# Patient Record
Sex: Female | Born: 2000 | Race: White | Hispanic: No | Marital: Single | Smoking: Never smoker
Health system: Southern US, Community
[De-identification: ages and names within clinical notes are randomized; demographics above are authoritative.]

## PROBLEM LIST (undated history)

## (undated) DIAGNOSIS — F988 Other specified behavioral and emotional disorders with onset usually occurring in childhood and adolescence: Secondary | ICD-10-CM

## (undated) DIAGNOSIS — L309 Dermatitis, unspecified: Secondary | ICD-10-CM

## (undated) DIAGNOSIS — E282 Polycystic ovarian syndrome: Secondary | ICD-10-CM

## (undated) DIAGNOSIS — J45909 Unspecified asthma, uncomplicated: Secondary | ICD-10-CM

## (undated) HISTORY — PX: LAPAROSCOPIC OVARIAN CYSTECTOMY: SHX6248

## (undated) HISTORY — PX: WISDOM TOOTH EXTRACTION: SHX21

## (undated) HISTORY — DX: Dermatitis, unspecified: L30.9

## (undated) HISTORY — PX: TONSILLECTOMY: SUR1361

## (undated) HISTORY — PX: ADENOIDECTOMY: SUR15

## (undated) HISTORY — DX: Unspecified asthma, uncomplicated: J45.909

---
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
## 2022-03-16 DIAGNOSIS — F4312 Post-traumatic stress disorder, chronic: Secondary | ICD-10-CM | POA: Insufficient documentation

## 2022-06-03 DIAGNOSIS — F319 Bipolar disorder, unspecified: Secondary | ICD-10-CM | POA: Insufficient documentation

## 2023-05-15 ENCOUNTER — Ambulatory Visit: Payer: Self-pay | Admitting: Allergy

## 2023-05-19 ENCOUNTER — Other Ambulatory Visit (HOSPITAL_COMMUNITY): Payer: BC Managed Care – PPO

## 2023-05-19 ENCOUNTER — Emergency Department (HOSPITAL_BASED_OUTPATIENT_CLINIC_OR_DEPARTMENT_OTHER)
Admission: EM | Admit: 2023-05-19 | Discharge: 2023-05-19 | Disposition: A | Discharge status: Home / Self Care | Payer: BC Managed Care – PPO | Attending: Emergency Medicine | Admitting: Emergency Medicine

## 2023-05-19 ENCOUNTER — Other Ambulatory Visit: Payer: Self-pay

## 2023-05-19 ENCOUNTER — Encounter (HOSPITAL_BASED_OUTPATIENT_CLINIC_OR_DEPARTMENT_OTHER): Payer: Self-pay

## 2023-05-19 ENCOUNTER — Emergency Department (HOSPITAL_COMMUNITY): Payer: BC Managed Care – PPO

## 2023-05-19 DIAGNOSIS — R102 Pelvic and perineal pain: Secondary | ICD-10-CM

## 2023-05-19 DIAGNOSIS — N83202 Unspecified ovarian cyst, left side: Secondary | ICD-10-CM | POA: Diagnosis not present

## 2023-05-19 HISTORY — DX: Polycystic ovarian syndrome: E28.2

## 2023-05-19 LAB — CBC WITH DIFFERENTIAL/PLATELET
Abs Immature Granulocytes: 0.01 10*3/uL (ref 0.00–0.07)
Basophils Absolute: 0.1 10*3/uL (ref 0.0–0.1)
Basophils Relative: 1 %
Eosinophils Absolute: 0.3 10*3/uL (ref 0.0–0.5)
Eosinophils Relative: 4 %
HCT: 38.2 % (ref 36.0–46.0)
Hemoglobin: 12.8 g/dL (ref 12.0–15.0)
Immature Granulocytes: 0 %
Lymphocytes Relative: 47 %
Lymphs Abs: 4.4 10*3/uL — ABNORMAL HIGH (ref 0.7–4.0)
MCH: 29.1 pg (ref 26.0–34.0)
MCHC: 33.5 g/dL (ref 30.0–36.0)
MCV: 86.8 fL (ref 80.0–100.0)
Monocytes Absolute: 0.4 10*3/uL (ref 0.1–1.0)
Monocytes Relative: 5 %
Neutro Abs: 3.9 10*3/uL (ref 1.7–7.7)
Neutrophils Relative %: 43 %
Platelets: 345 10*3/uL (ref 150–400)
RBC: 4.4 MIL/uL (ref 3.87–5.11)
RDW: 12.4 % (ref 11.5–15.5)
WBC: 9.1 10*3/uL (ref 4.0–10.5)
nRBC: 0 % (ref 0.0–0.2)

## 2023-05-19 LAB — BASIC METABOLIC PANEL
Anion gap: 7 (ref 5–15)
BUN: 13 mg/dL (ref 6–20)
CO2: 25 mmol/L (ref 22–32)
Calcium: 9.1 mg/dL (ref 8.9–10.3)
Chloride: 108 mmol/L (ref 98–111)
Creatinine, Ser: 0.89 mg/dL (ref 0.44–1.00)
GFR, Estimated: >60 mL/min (ref >60)
Glucose, Bld: 90 mg/dL (ref 70–99)
Potassium: 3.8 mmol/L (ref 3.5–5.1)
Sodium: 140 mmol/L (ref 135–145)

## 2023-05-19 LAB — HCG, SERUM, QUALITATIVE: Preg, Serum: NEGATIVE

## 2023-05-19 MED ORDER — ONDANSETRON HCL 4 MG/2ML IJ SOLN
INTRAMUSCULAR | Status: AC
  Filled 2023-05-19: qty 2

## 2023-05-19 MED ORDER — ONDANSETRON HCL 4 MG/2ML IJ SOLN
4.0000 mg | Freq: Once | INTRAMUSCULAR | Status: DC
  Filled 2023-05-19: qty 2

## 2023-05-19 MED ORDER — KETOROLAC TROMETHAMINE 30 MG/ML IJ SOLN
15.0000 mg | Freq: Once | INTRAMUSCULAR | Status: AC
  Administered 2023-05-19: 15 mg via INTRAVENOUS
  Filled 2023-05-19: qty 1

## 2023-05-19 MED ORDER — OXYCODONE-ACETAMINOPHEN 5-325 MG PO TABS: 1.0000 | ORAL_TABLET | Freq: Four times a day (QID) | ORAL | 0 refills | Status: DC | PRN

## 2023-05-19 MED ORDER — MORPHINE SULFATE (PF) 4 MG/ML IV SOLN
4.0000 mg | Freq: Once | INTRAVENOUS | Status: AC
  Administered 2023-05-19: 4 mg via INTRAVENOUS
  Filled 2023-05-19: qty 1

## 2023-05-19 MED ORDER — FENTANYL CITRATE PF 50 MCG/ML IJ SOSY
25.0000 ug | PREFILLED_SYRINGE | Freq: Once | INTRAMUSCULAR | Status: AC
  Administered 2023-05-19: 25 ug via INTRAVENOUS
  Filled 2023-05-19: qty 1

## 2023-05-19 MED ORDER — ONDANSETRON HCL 4 MG/2ML IJ SOLN
4.0000 mg | Freq: Once | INTRAMUSCULAR | Status: AC
  Administered 2023-05-19: 4 mg via INTRAVENOUS

## 2023-05-19 NOTE — ED Notes (Signed)
Pt OTF for ultrasound

## 2023-05-19 NOTE — ED Triage Notes (Signed)
 Pt woke up an hour ago with severe lower abdominal pain. Pt recently diagnosed with left ovarian cyst and is scheduled for surgery in a couple of weeks. This pain is consistent with cyst related pain. Denies vomiting and/or diarrhea, but does endorse nausea with the pain.

## 2023-05-19 NOTE — ED Provider Notes (Signed)
 Patient arriving from [facility] as a transfer to get a pelvic ultrasound to rule out torsion.  Patient with a known left ovarian cyst that she reports is going to have surgery in the next few weeks once it gets scheduled but woke up this morning with severe pain.  There is no ultrasound available at [facility] and OB/GYN recommended transfer for ultrasound.  Patient is still having significant pain in her left lower abdomen.  Blood work and urine pregnancy were negative.  Ultrasound was ordered.  9:46 AM Results of the ultrasound show:  1. No evidence of ovarian torsion. 2. Complex left ovarian cystic structure containing thick internal septations and an eccentric, irregular, mildly echogenic structure measuring 4.0 x 3.8 x 3.7 cm, which demonstrates internal vascularity on Doppler evaluation. While this structure may represent a hemorrhagic cyst/endometrioma, the apparent internal vascularity of the echogenic structure raises suspicion for epithelial ovarian neoplasm. Per clinical records, surgery is planned. 3. Small volume pelvic free fluid. Patient is planning on surgery.  At this time we will provide further medication that she can use for pain control. Findings discussed with the patient and her family member who is present at bedside.  At this time will give a prescription for pain medication to use as needed and she will call her OB/GYN in the morning.   Gwyneth Sprout, MD 05/19/23 [phone]

## 2023-05-19 NOTE — Discharge Instructions (Signed)
 There was no sign of torsion today of your ovary.  However you do have a large cyst there which is most likely what is causing your pain.  Use the pain medication as needed and follow-up with your OB/GYN tomorrow.

## 2023-05-19 NOTE — ED Provider Notes (Signed)
 [place] EMERGENCY DEPARTMENT AT [facility] Provider Note   CSN: [id number] Arrival date & time: 05/19/23  0534     History  Chief Complaint  Patient presents with   Abdominal Pain    Patty Mccarthy is a 23 y.o. female.  Patient presents to the emergency department for evaluation of severe left lower abdominal/pelvic pain that started an hour ago.  Patient reports that she has a cyst on her left ovary and is pending possible surgery for this.  She reports that she has been having some pain for 3 weeks but this morning the pain became severe quite suddenly, waking her from sleep.       Home Medications Prior to Admission medications   Medication Sig Start Date End Date Taking? Authorizing Provider  cetirizine (ZYRTEC) 10 MG tablet Take 10 mg by mouth daily.    [provider]  lisdexamfetamine (VYVANSE) 40 MG capsule Take 40 mg by mouth every morning.    [provider]      Allergies    Doxycycline hyclate and Prednisone    Review of Systems   Review of Systems  Physical Exam Updated Vital Signs BP 122/80   Pulse 74   Temp 98.6 F (37 C) (Oral)   Resp 15   SpO2 100%  Physical Exam Vitals and nursing note reviewed.  Constitutional:      General: She is in acute distress.     Appearance: She is well-developed.  HENT:     Head: Normocephalic and atraumatic.     Mouth/Throat:     Mouth: Mucous membranes are moist.  Eyes:     General: Vision grossly intact. Gaze aligned appropriately.     Extraocular Movements: Extraocular movements intact.     Conjunctiva/sclera: Conjunctivae normal.  Cardiovascular:     Rate and Rhythm: Normal rate and regular rhythm.     Pulses: Normal pulses.     Heart sounds: Normal heart sounds, S1 normal and S2 normal. No murmur heard.    No friction rub. No gallop.  Pulmonary:     Effort: Pulmonary effort is normal. No respiratory distress.     Breath sounds: Normal breath sounds.  Abdominal:      General: Bowel sounds are normal.     Palpations: Abdomen is soft.     Tenderness: There is abdominal tenderness in the left lower quadrant. There is no guarding or rebound.     Hernia: No hernia is present.  Musculoskeletal:        General: No swelling.     Cervical back: Full passive range of motion without pain, normal range of motion and neck supple. No spinous process tenderness or muscular tenderness. Normal range of motion.     Right lower leg: No edema.     Left lower leg: No edema.  Skin:    General: Skin is warm and dry.     Capillary Refill: Capillary refill takes less than 2 seconds.     Findings: No ecchymosis, erythema, rash or wound.  Neurological:     General: No focal deficit present.     Mental Status: She is alert and oriented to person, place, and time.     GCS: GCS eye subscore is 4. GCS verbal subscore is 5. GCS motor subscore is 6.     Cranial Nerves: Cranial nerves 2-12 are intact.     Sensory: Sensation is intact.     Motor: Motor function is intact.     Coordination: Coordination  is intact.  Psychiatric:        Attention and Perception: Attention normal.        Mood and Affect: Mood normal.        Speech: Speech normal.        Behavior: Behavior normal.     ED Results / Procedures / Treatments   Labs (all labs ordered are listed, but only abnormal results are displayed) Labs Reviewed  CBC WITH DIFFERENTIAL/PLATELET - Abnormal; Notable for the following components:      Result Value   Lymphs Abs 4.4 (*)    All other components within normal limits  BASIC METABOLIC PANEL  HCG, SERUM, QUALITATIVE    EKG None  Radiology No results found.  Procedures Procedures    Medications Ordered in ED Medications  fentaNYL (SUBLIMAZE) injection 25 mcg (25 mcg Intravenous Given 05/19/23 0615)  ondansetron (ZOFRAN) 4 MG/2ML injection (4 mg Intravenous Given 05/19/23 1610)    ED Course/ Medical Decision Making/ A&P                                  Medical Decision Making Amount and/or Complexity of Data Reviewed Labs: ordered.  Risk Prescription drug management.   Patient with sudden onset severe left lower quadrant pain.  Patient with history of PCOS.  Outside ultrasound was reviewed.  Patient does have a large cystic lesion on the left ovary, felt to be hemorrhagic cyst.  Differential diagnosis would be painful ovarian cyst, ruptured hemorrhagic cyst, ovarian torsion.  Pregnancy negative, so ectopic pregnancy ruled out.  Discussed with Dr. Vergie Living, on-call for OB/GYN at [facility].    A quick FAST exam does not show any fluid in the abdomen or pelvis.  Hemoglobin is normal.  Patient accepted for transfer for pelvic ultrasound to rule out torsion.  FAST BEDSIDE US Indication: pelvic pain, rule out ruptured hemorrhagic cyst  3 Views obtained: Splenorenal, Morrison's Pouch, Retrovesical No free fluid in abdomen  Archived electronically I personally performed and interrepreted the images         Final Clinical Impression(s) / ED Diagnoses Final diagnoses:  Pelvic pain  Cyst of left ovary    Rx / DC Orders ED Discharge Orders     None         Gilda Crease, MD 05/19/23 [phone]

## 2023-05-29 ENCOUNTER — Ambulatory Visit: Payer: Self-pay | Admitting: Allergy

## 2023-06-25 NOTE — Progress Notes (Unsigned)
 New Patient Note  RE: Patty Mccarthy MRN: [id number] DOB: Aug 12, 2000 Date of Office Visit: 06/26/2023  Consult requested by: Barbarann Ehlers Primary care provider: System, Provider Not In  Chief Complaint: No chief complaint on file.  History of Present Illness: I had the pleasure of seeing Patty Mccarthy for initial evaluation at the [facility] on 06/25/2023. She is a 23 y.o. female, who is referred here by System, Provider Not In for the evaluation of NSAID allergy.  Discussed the use of AI scribe software for clinical note transcription with the patient, who gave verbal consent to proceed.  History of Present Illness             Drug reaction: Reaction to *** which occurred *** ago.  Patient was being treated for ***. Symptoms started after taking ***. Symptoms include: *** Symptoms persisted for ***.  Treatment included: ***. Mucosal involvement: {Blank single:19197::"yes ***","no"}.  Denies any *** fevers, chills, changes in medications, foods, personal care products or recent infections. she has tried the following therapies: *** with *** benefit. Systemic steroids ***.  Previous work up includes: ***. Previous history of rash/hives: ***. Previous history of drug reactions: ***.  Assessment and Plan: [name] is a 23 y.o. female with: ***  Assessment and Plan               No follow-ups on file.  No orders of the defined types were placed in this encounter.  Lab Orders  No laboratory test(s) ordered today    Other allergy screening: Asthma: {Blank single:19197::"yes","no"} Rhino conjunctivitis: {Blank single:19197::"yes","no"} Food allergy: {Blank single:19197::"yes","no"} Medication allergy: {Blank single:19197::"yes","no"} Hymenoptera allergy: {Blank single:19197::"yes","no"} Urticaria: {Blank single:19197::"yes","no"} Eczema:{Blank single:19197::"yes","no"} History of recurrent infections suggestive of  immunodeficency: {Blank single:19197::"yes","no"}  Diagnostics: Spirometry:  Tracings reviewed. Her effort: {Blank single:19197::"Good reproducible efforts.","It was hard to get consistent efforts and there is a question as to whether this reflects a maximal maneuver.","Poor effort, data can not be interpreted."} FVC: ***L FEV1: ***L, ***% predicted FEV1/FVC ratio: ***% Interpretation: {Blank single:19197::"Spirometry consistent with mild obstructive disease","Spirometry consistent with moderate obstructive disease","Spirometry consistent with severe obstructive disease","Spirometry consistent with possible restrictive disease","Spirometry consistent with mixed obstructive and restrictive disease","Spirometry uninterpretable due to technique","Spirometry consistent with normal pattern","No overt abnormalities noted given today's efforts"}.  Please see scanned spirometry results for details.  Skin Testing: {Blank single:19197::"Select foods","Environmental allergy panel","Environmental allergy panel and select foods","Food allergy panel","None","Deferred due to recent antihistamines use"}. *** Results discussed with patient/family.   Past Medical History: There are no active problems to display for this patient.  Past Medical History:  Diagnosis Date  . ADD (attention deficit disorder)   . PCOS (polycystic ovarian syndrome)    Past Surgical History: Past Surgical History:  Procedure Laterality Date  . TONSILLECTOMY     Medication List:  Current Outpatient Medications  Medication Sig Dispense Refill  . cetirizine (ZYRTEC) 10 MG tablet Take 10 mg by mouth daily.    Marland Kitchen lisdexamfetamine (VYVANSE) 40 MG capsule Take 40 mg by mouth every morning.    Marland Kitchen oxyCODONE-acetaminophen (PERCOCET/ROXICET) 5-325 MG tablet Take 1 tablet by mouth every 6 (six) hours as needed for severe pain (pain score 7-10). 15 tablet 0   No current facility-administered medications for this visit.    Allergies: Allergies  Allergen Reactions  . Doxycycline Hyclate Nausea And Vomiting  . Prednisone Hives   Social History: Social History   Socioeconomic History  . Marital status: Single    Spouse name: Not on  file  . Number of children: Not on file  . Years of education: Not on file  . Highest education level: Not on file  Occupational History  . Not on file  Tobacco Use  . Smoking status: Passive Smoke Exposure - Never Smoker  . Smokeless tobacco: Not on file  Substance and Sexual Activity  . Alcohol use: Not on file  . Drug use: Not on file  . Sexual activity: Not on file  Other Topics Concern  . Not on file  Social History Narrative  . Not on file   Social Drivers of Health   Financial Resource Strain: Patient Declined (06/21/2023)   Received from [facility]   Overall Financial Resource Strain (CARDIA)   . Difficulty of Paying Living Expenses: Patient declined  Recent Concern: Financial Resource Strain - High Risk (03/23/2023)   Received from [facility]   Overall Financial Resource Strain (CARDIA)   . Difficulty of Paying Living Expenses: Hard  Food Insecurity: Patient Declined (06/21/2023)   Received from [facility]   Hunger Vital Sign   . Worried About Programme researcher, broadcasting/film/video in the Last Year: Patient declined   . Ran Out of Food in the Last Year: Patient declined  Recent Concern: Food Insecurity - Food Insecurity Present (03/23/2023)   Received from [facility]   Hunger Vital Sign   . Worried About Programme researcher, broadcasting/film/video in the Last Year: Sometimes true   . Ran Out of Food in the Last Year: Sometimes true  Transportation Needs: Patient Declined (06/21/2023)   Received from [facility] - Transportation   . Lack of Transportation (Medical): Patient declined   . Lack of Transportation (Non-Medical): Patient declined  Physical Activity: Unknown (06/21/2023)   Received from [facility]   Exercise Vital Sign   . Days of Exercise per Week:  Patient declined   . Minutes of Exercise per Session: 20 min  Recent Concern: Physical Activity - Insufficiently Active (03/23/2023)   Received from [facility]   Exercise Vital Sign   . Days of Exercise per Week: 3 days   . Minutes of Exercise per Session: 20 min  Stress: Patient Declined (06/21/2023)   Received from [facility] - Occupational Stress Questionnaire   . Feeling of Stress : Patient declined  Social Connections: Patient Declined (06/21/2023)   Received from [facility]   Social Network   . How would you rate your social network (family, work, friends)?: Patient declined  Recent Concern: Social Connections - Somewhat Isolated (03/23/2023)   Received from [facility]   Social Network   . How would you rate your social network (family, work, friends)?: Restricted participation with some degree of social isolation   Lives in a ***. Smoking: *** Occupation: ***  Environmental HistorySurveyor, minerals in the house: Copywriter, advertising in the family room: {Blank single:19197::"yes","no"} Carpet in the bedroom: {Blank single:19197::"yes","no"} Heating: {Blank single:19197::"electric","gas","heat pump"} Cooling: {Blank single:19197::"central","window","heat pump"} Pet: {Blank single:19197::"yes ***","no"}  Family History: No family history on file. Problem                               Relation Asthma                                   *** Eczema                                ***  Food allergy                          *** Allergic rhino conjunctivitis     ***  Review of Systems  Constitutional:  Negative for appetite change, chills, fever and unexpected weight change.  HENT:  Negative for congestion and rhinorrhea.   Eyes:  Negative for itching.  Respiratory:  Negative for cough, chest tightness, shortness of breath and wheezing.   Cardiovascular:  Negative for chest pain.  Gastrointestinal:   Negative for abdominal pain.  Genitourinary:  Negative for difficulty urinating.  Skin:  Negative for rash.  Neurological:  Negative for headaches.   Objective: There were no vitals taken for this visit. There is no height or weight on file to calculate BMI. Physical Exam Vitals and nursing note reviewed.  Constitutional:      Appearance: Normal appearance. She is well-developed.  HENT:     Head: Normocephalic and atraumatic.     Right Ear: Tympanic membrane and external ear normal.     Left Ear: Tympanic membrane and external ear normal.     Nose: Nose normal.     Mouth/Throat:     Mouth: Mucous membranes are moist.     Pharynx: Oropharynx is clear.  Eyes:     Conjunctiva/sclera: Conjunctivae normal.  Cardiovascular:     Rate and Rhythm: Normal rate and regular rhythm.     Heart sounds: Normal heart sounds. No murmur heard.    No friction rub. No gallop.  Pulmonary:     Effort: Pulmonary effort is normal.     Breath sounds: Normal breath sounds. No wheezing, rhonchi or rales.  Musculoskeletal:     Cervical back: Neck supple.  Skin:    General: Skin is warm.     Findings: No rash.  Neurological:     Mental Status: She is alert and oriented to person, place, and time.  Psychiatric:        Behavior: Behavior normal.  The plan was reviewed with the patient/family, and all questions/concerned were addressed.  It was my pleasure to see Anida today and participate in her care. Please feel free to contact me with any questions or concerns.  Sincerely,  Wyline Mood, DO Allergy & Immunology  [facility] office: [phone] [facility] office: [phone]

## 2023-06-26 ENCOUNTER — Other Ambulatory Visit: Payer: Self-pay

## 2023-06-26 ENCOUNTER — Encounter: Payer: Self-pay | Admitting: Allergy

## 2023-06-26 ENCOUNTER — Ambulatory Visit (INDEPENDENT_AMBULATORY_CARE_PROVIDER_SITE_OTHER): Admitting: Allergy

## 2023-06-26 VITALS — BP 114/70 | HR 96 | Temp 98.4°F | Resp 16 | Ht 65.5 in | Wt 180.7 lb

## 2023-06-26 DIAGNOSIS — T50995D Adverse effect of other drugs, medicaments and biological substances, subsequent encounter: Secondary | ICD-10-CM

## 2023-06-26 DIAGNOSIS — B999 Unspecified infectious disease: Secondary | ICD-10-CM | POA: Diagnosis not present

## 2023-06-26 DIAGNOSIS — J3089 Other allergic rhinitis: Secondary | ICD-10-CM | POA: Diagnosis not present

## 2023-06-26 DIAGNOSIS — Z888 Allergy status to other drugs, medicaments and biological substances status: Secondary | ICD-10-CM

## 2023-06-26 DIAGNOSIS — J4540 Moderate persistent asthma, uncomplicated: Secondary | ICD-10-CM

## 2023-06-26 DIAGNOSIS — T781XXD Other adverse food reactions, not elsewhere classified, subsequent encounter: Secondary | ICD-10-CM

## 2023-06-26 MED ORDER — AEROCHAMBER MV MISC: 2 refills | Status: DC

## 2023-06-26 MED ORDER — BUDESONIDE-FORMOTEROL FUMARATE 80-4.5 MCG/ACT IN AERO: 2.0000 | INHALATION_SPRAY | Freq: Two times a day (BID) | RESPIRATORY_TRACT | 3 refills | Status: DC

## 2023-06-26 NOTE — Patient Instructions (Addendum)
 Prednisone allergy? Low likelihood as you tolerates steroid containing inhalers and nasal sprays with no issues in the past.  If interested we can schedule drug challenge to prednisolone.  Drug challenge instructions: You must be off antihistamines for 3-5 days before. Must be in good health and not ill. No vaccines/injections/antibiotics within the past 7 days.  Plan on being in the office for 2-3 hours and must bring in the drug you want to do the oral challenge for - will send in prescription to pick up a few days before.  You must call to schedule an appointment and specify it's for a drug challenge.   Rhinitis  Return for allergy skin testing. Will make additional recommendations based on results. Make sure you don't take any antihistamines for 3 days before the skin testing appointment. Don't put any lotion on the back and arms on the day of testing.  Plan on being here for 30-60 minutes.  Use over the counter antihistamines such as Zyrtec (cetirizine), Claritin (loratadine), Allegra (fexofenadine), or Xyzal (levocetirizine) daily as needed. May take twice a day during allergy flares. May switch antihistamines every few months. Stop 3 days before testing visit.  Asthma Daily controller medication(s): start Symbicort 2 puffs twice a day with spacer and rinse mouth afterwards. This has a steroid in it but I don't think this will give you a problem. If you notice any issues let us know and stop.  Spacer prescription sent in. May use albuterol rescue inhaler 2 puffs or nebulizer every 4 to 6 hours as needed for shortness of breath, chest tightness, coughing, and wheezing. May use albuterol rescue inhaler 2 puffs 5 to 15 minutes prior to strenuous physical activities. Monitor frequency of use - if you need to use it more than twice per week on a consistent basis let us know.  Breathing control goals:  Full participation in all desired activities (may need albuterol before  activity) Albuterol use two times or less a week on average (not counting use with activity) Cough interfering with sleep two times or less a month Oral steroids no more than once a year No hospitalizations   Foods Continue to avoid foods that are bothersome - strawberries, pineapples, oranges, lemons. Testing sheet given for you to circle items.  No need to test for foods that you are eating without any issues. For mild symptoms you can take over the counter antihistamines such as Benadryl 1-2 tablets = 25-50mg  and monitor symptoms closely.  If symptoms worsen or if you have severe symptoms including breathing issues, throat closure, significant swelling, whole body hives, severe diarrhea and vomiting, lightheadedness then seek immediate medical care.  Infections Keep track of infections and antibiotics use. If persistent will get bloodwork next to look at immune system.   Follow up for skin testing first. Then schedule for prednisone challenge.

## 2023-07-02 NOTE — Progress Notes (Deleted)
 Skin testing note  RE: Patty Mccarthy MRN: [id number] DOB: 05-10-2000 Date of Office Visit: 07/03/2023  Referring provider: No ref. provider found Primary care provider: System, Provider Not In  Chief Complaint: skin testing  History of Present Illness: I had the pleasure of seeing Patty Mccarthy for a skin testing visit at the [facility] on 07/02/2023. She is a 23 y.o. female, who is being followed for adverse drug reactions, recurrent infections, asthma, allergic rhinitis, adverse food reaction and oral allergy syndrome. Her previous allergy office visit was on 06/26/2023 with Dr. Selena Batten. Today is a skin testing visit.   Discussed the use of AI scribe software for clinical note transcription with the patient, who gave verbal consent to proceed.  History of Present Illness             *** Assessment and Plan: Patty Mccarthy is a 23 y.o. female with: Adverse effect of other drugs, medicaments and biological substances, subsequent encounter Reported hives after prednisone use for asthma exacerbation due to pneumonia in 2015. Tolerated Flonase and Wixela in the past.  Low likelihood as tolerating steroid containing inhalers and nasal sprays with no issues in the past. If interested we can schedule drug challenge to prednisolone.    Recurrent infections Multiple antibiotic courses for respiratory infections.  Keep track of infections and antibiotics use. If persistent will get bloodwork next to look at immune system.    Not well controlled moderate persistent asthma Exacerbated by illness, exercise, and seasonal changes. Used to be on [employer]. Uses albuterol few times per week with good benefit. Today's spirometry was unremarkable. Daily controller medication(s): start Symbicort 2 puffs twice a day with spacer and rinse mouth afterwards. This has a steroid in it but I don't think this will give you a problem. If you notice any issues let us know and stop.  Spacer  prescription sent in. May use albuterol rescue inhaler 2 puffs or nebulizer every 4 to 6 hours as needed for shortness of breath, chest tightness, coughing, and wheezing. May use albuterol rescue inhaler 2 puffs 5 to 15 minutes prior to strenuous physical activities. Monitor frequency of use - if you need to use it more than twice per week on a consistent basis let us know.    Other allergic rhinitis Perennial symptoms. Nasal sprays cause epistaxis.  Return for allergy skin testing. Will make additional recommendations based on results. Use over the counter antihistamines such as Zyrtec (cetirizine), Claritin (loratadine), Allegra (fexofenadine), or Xyzal (levocetirizine) daily as needed. May take twice a day during allergy flares. May switch antihistamines every few months.   Other adverse food reactions, not elsewhere classified, subsequent encounter Oral allergy syndrome, subsequent encounter Resolved childhood food allergies to milk, egg and chicken. Current oral allergy symptoms with certain fruits.  Continue to avoid foods that are bothersome - strawberries, pineapples, oranges, lemons. Testing sheet given - return for select food testing at next visit.  No need to test for foods that you are eating without any issues. For mild symptoms you can take over the counter antihistamines such as Benadryl 1-2 tablets = 25-50mg  and monitor symptoms closely.  If symptoms worsen or if you have severe symptoms including breathing issues, throat closure, significant swelling, whole body hives, severe diarrhea and vomiting, lightheadedness then seek immediate medical care.  Assessment and Plan              No follow-ups on file.  No orders of the defined types were  placed in this encounter.  Lab Orders  No laboratory test(s) ordered today    Diagnostics: Spirometry:  Tracings reviewed. Her effort: {Blank single:19197::"Good reproducible efforts.","It was hard to get consistent efforts and  there is a question as to whether this reflects a maximal maneuver.","Poor effort, data can not be interpreted."} FVC: ***L FEV1: ***L, ***% predicted FEV1/FVC ratio: ***% Interpretation: {Blank single:19197::"Spirometry consistent with mild obstructive disease","Spirometry consistent with moderate obstructive disease","Spirometry consistent with severe obstructive disease","Spirometry consistent with possible restrictive disease","Spirometry consistent with mixed obstructive and restrictive disease","Spirometry uninterpretable due to technique","Spirometry consistent with normal pattern","No overt abnormalities noted given today's efforts"}.  Please see scanned spirometry results for details.  Skin Testing: Environmental allergy panel and select foods. *** Results discussed with patient/family.   Previous notes and tests were reviewed. The plan was reviewed with the patient/family, and all questions/concerned were addressed.  It was my pleasure to see Patty Mccarthy today and participate in her care. Please feel free to contact me with any questions or concerns.  Sincerely,  Wyline Mood, DO Allergy & Immunology  [facility] office: [phone] [facility] office: [phone]

## 2023-07-03 ENCOUNTER — Ambulatory Visit: Admitting: Allergy

## 2023-07-07 NOTE — Progress Notes (Unsigned)
 Skin testing note  RE: Patty Mccarthy MRN: [id number] DOB: 07/03/2000 Date of Office Visit: 07/08/2023  Referring provider: No ref. provider found Primary care provider: Tova Fresh, PA-C  Chief Complaint: skin testing  History of Present Illness: I had the pleasure of seeing Patty Mccarthy for a skin testing visit at the [facility] on 07/08/2023. She is a 23 y.o. female, who is being followed for adverse drug reactions, recurrent infections, asthma, allergic rhinitis, adverse food reaction and oral allergy syndrome. Her previous allergy office visit was on 06/26/2023 with Dr. Burdette Carolin. Today is a skin testing visit.  She is accompanied today by her partner who provided/contributed to the history.   Discussed the use of AI scribe software for clinical note transcription with the patient, who gave verbal consent to proceed.     She uses a Symbicort inhaler twice daily without any side effects. It is currently too early to assess its effectiveness.     Assessment and Plan: Patty Mccarthy is a 23 y.o. female with: Other allergic rhinitis Past history - perennial symptoms. Nasal sprays cause epistaxis.  Today's skin testing positive to grass, weed, trees, mold, dust mites, horse, cat. Borderline to tobacco and dog.  Start environmental control measures as below. Use over the counter antihistamines such as Zyrtec (cetirizine), Claritin (loratadine), Allegra (fexofenadine), or Xyzal (levocetirizine) daily as needed. May take twice a day during allergy flares. May switch antihistamines every few months. Consider allergy injections for long term control if above medications do not help the symptoms - handout given.   Adverse effect of other drugs, medicaments and biological substances, subsequent encounter Past history - reported hives after prednisone use for asthma exacerbation due to pneumonia in 2015. Tolerated Flonase and Wixela in the past.  Low likelihood as tolerating  steroid containing inhalers and nasal sprays with no issues in the past. If interested we can schedule drug challenge to prednisolone.    Recurrent infections Past history - multiple antibiotic courses for respiratory infections.  Keep track of infections and antibiotics use. If persistent will get bloodwork next to look at immune system.    Moderate persistent asthma Past history - exacerbated by illness, exercise, and seasonal changes. Used to be on [employer]. Uses albuterol few times per week with good benefit. 2025 spirometry was unremarkable. Daily controller medication(s): Symbicort 80mcg 2 puffs twice a day with spacer and rinse mouth afterwards. May use albuterol rescue inhaler 2 puffs or nebulizer every 4 to 6 hours as needed for shortness of breath, chest tightness, coughing, and wheezing. May use albuterol rescue inhaler 2 puffs 5 to 15 minutes prior to strenuous physical activities. Monitor frequency of use - if you need to use it more than twice per week on a consistent basis let us  know.     Other adverse food reactions, not elsewhere classified, subsequent encounter Oral allergy syndrome, subsequent encounter Past history - resolved childhood food allergies to milk, egg and chicken. Current oral allergy symptoms with certain fruits.  Today's skin prick testing negative to select foods. Continue to avoid foods that are bothersome - strawberries, pineapples, oranges, lemons. No need to test for foods that you are eating without any issues. For mild symptoms you can take over the counter antihistamines such as Benadryl 1-2 tablets = 25-50mg  and monitor symptoms closely.  If symptoms worsen or if you have severe symptoms including breathing issues, throat closure, significant swelling, whole body hives, severe diarrhea and vomiting, lightheadedness then seek immediate medical  care..    Return in about 2 months (around 09/07/2023).  No orders of the defined types were placed in this  encounter.  Lab Orders  No laboratory test(s) ordered today    Diagnostics: Skin Testing: Environmental allergy panel and select foods. Today's skin testing positive to grass, weed, trees, mold, dust mites, horse, cat. Borderline to tobacco and dog.  Select food testing were negative.  Results discussed with patient/family.  Airborne Adult Perc - 07/08/23 1408     Time Antigen Placed 1408    Allergen Manufacturer Floyd Hutchinson    Location Back    Number of Test 55    Panel 1 Select    1. Control-Buffer 50% Glycerol Negative    2. Control-Histamine 3+    3. Bahia 2+    4. [place] 2+    5. Johnson 2+    6. [place]  Blue 4+    7. Meadow Fescue 4+    8. Perennial Rye 4+    9. Timothy 4+    10. Ragweed Mix Negative    11. Cocklebur Negative    12. Plantain,  English 2+    13. Baccharis Negative    14. Dog Fennel Negative    15. Russian Thistle Negative    16. Lamb's Quarters 2+    17. Sheep Sorrell 2+    18. Rough Pigweed Negative    19. Marsh Elder, Rough Negative    20. Mugwort, Common 2+    21. Box, Elder Negative    22. Cedar, red Negative    23. Sweet Gum 2+    24. Pecan Pollen 2+    25. Pine Mix Negative    26. Walnut, Black Pollen Negative    27. Red Mulberry 2+    28. Ash Mix Negative    29. Birch Mix 2+    30. Beech American 2+    31. Cottonwood, Guinea-Bissau Negative    33. Maple Mix 2+    34. Oak, [place] Mix 2+    35. Sycamore Eastern Negative    36. Alternaria Alternata Negative    37. Cladosporium Herbarum 2+    38. Aspergillus Mix Negative    39. Penicillium Mix Negative    40. Bipolaris Sorokiniana (Helminthosporium) 2+    41. Drechslera Spicifera (Curvularia) Negative    42. Mucor Plumbeus Negative    43. Fusarium Moniliforme 2+    44. Aureobasidium Pullulans (pullulara) Negative    45. Rhizopus Oryzae --   +/-   46. Botrytis Cinera Negative    47. Epicoccum Nigrum Negative    48. Phoma Betae Negative    49. Dust Mite Mix 3+    50. Cat Hair 10,000  BAU/ml Negative    51.  Dog Epithelia Negative    52. Mixed Feathers Negative    53. Horse Epithelia 2+    54. Cockroach, German Negative    55. Tobacco Leaf --   +/-            Intradermal - 07/08/23 1436     Time Antigen Placed 1436    Allergen Manufacturer Floyd Hutchinson    Location Arm    Number of Test 6    Control Negative    Ragweed Mix Negative    Mold 2 Negative    Cat 3+    Dog --   +/-   Cockroach Negative             Food Adult Perc - 07/08/23 1400     Time  Antigen Placed 1409    Allergen Manufacturer Greer    Location Back    Number of allergen test 4    55. Orange  Negative    56. Lemon Negative    60. Strawberry Negative    65. Pineapple Negative             Previous notes and tests were reviewed. The plan was reviewed with the patient/family, and all questions/concerned were addressed.  It was my pleasure to see Patty Mccarthy today and participate in her care. Please feel free to contact me with any questions or concerns.  Sincerely,  Eudelia Hero, DO Allergy & Immunology  [facility]  [facility] office: [phone] [facility] office: [phone]

## 2023-07-08 ENCOUNTER — Ambulatory Visit (INDEPENDENT_AMBULATORY_CARE_PROVIDER_SITE_OTHER): Admitting: Allergy

## 2023-07-08 ENCOUNTER — Encounter: Payer: Self-pay | Admitting: Allergy

## 2023-07-08 VITALS — BP 130/84 | HR 98 | Temp 98.6°F | Ht 65.5 in | Wt 182.2 lb

## 2023-07-08 DIAGNOSIS — J3089 Other allergic rhinitis: Secondary | ICD-10-CM

## 2023-07-08 DIAGNOSIS — T781XXD Other adverse food reactions, not elsewhere classified, subsequent encounter: Secondary | ICD-10-CM | POA: Diagnosis not present

## 2023-07-08 DIAGNOSIS — Z888 Allergy status to other drugs, medicaments and biological substances status: Secondary | ICD-10-CM

## 2023-07-08 DIAGNOSIS — B999 Unspecified infectious disease: Secondary | ICD-10-CM

## 2023-07-08 DIAGNOSIS — T50995D Adverse effect of other drugs, medicaments and biological substances, subsequent encounter: Secondary | ICD-10-CM

## 2023-07-08 DIAGNOSIS — J4540 Moderate persistent asthma, uncomplicated: Secondary | ICD-10-CM

## 2023-07-08 NOTE — Patient Instructions (Signed)
 Today's skin testing positive to grass, weed, trees, mold, dust mites, horse, cat. Borderline to tobacco and dog.  Select food testing were negative.   Results given.  Environmental allergies Start environmental control measures as below. Use over the counter antihistamines such as Zyrtec (cetirizine), Claritin (loratadine), Allegra (fexofenadine), or Xyzal (levocetirizine) daily as needed. May take twice a day during allergy flares. May switch antihistamines every few months. Consider allergy injections for long term control if above medications do not help the symptoms - handout given.   Prednisone allergy? Low likelihood as you tolerates steroid containing inhalers and nasal sprays with no issues in the past.  If interested we can schedule drug challenge to prednisolone.  Drug challenge instructions: You must be off antihistamines for 3-5 days before. Must be in good health and not ill. No vaccines/injections/antibiotics within the past 7 days.  Plan on being in the office for 2-3 hours and must bring in the drug you want to do the oral challenge for - will send in prescription to pick up a few days before.  You must call to schedule an appointment and specify it's for a drug challenge.   Asthma Daily controller medication(s): Symbicort 2 puffs twice a day with spacer and rinse mouth afterwards. May use albuterol rescue inhaler 2 puffs or nebulizer every 4 to 6 hours as needed for shortness of breath, chest tightness, coughing, and wheezing. May use albuterol rescue inhaler 2 puffs 5 to 15 minutes prior to strenuous physical activities. Monitor frequency of use - if you need to use it more than twice per week on a consistent basis let us know.  Breathing control goals:  Full participation in all desired activities (may need albuterol before activity) Albuterol use two times or less a week on average (not counting use with activity) Cough interfering with sleep two times or less a  month Oral steroids no more than once a year No hospitalizations   Foods Continue to avoid foods that are bothersome - strawberries, pineapples, oranges, lemons. No need to test for foods that you are eating without any issues. For mild symptoms you can take over the counter antihistamines such as Benadryl 1-2 tablets = 25-50mg  and monitor symptoms closely.  If symptoms worsen or if you have severe symptoms including breathing issues, throat closure, significant swelling, whole body hives, severe diarrhea and vomiting, lightheadedness then seek immediate medical care.  Infections Keep track of infections and antibiotics use. If persistent will get bloodwork next to look at immune system.   Follow up in 2 months or sooner if needed.   Reducing Pollen Exposure Pollen seasons: trees (spring), grass (summer) and ragweed/weeds (fall). Keep windows closed in your home and car to lower pollen exposure.  Install air conditioning in the bedroom and throughout the house if possible.  Avoid going out in dry windy days - especially early morning. Pollen counts are highest between 5 - 10 AM and on dry, hot and windy days.  Save outside activities for late afternoon or after a heavy rain, when pollen levels are lower.  Avoid mowing of grass if you have grass pollen allergy. Be aware that pollen can also be transported indoors on people and pets.  Dry your clothes in an automatic dryer rather than hanging them outside where they might collect pollen.  Rinse hair and eyes before bedtime. Mold Control Mold and fungi can grow on a variety of surfaces provided certain temperature and moisture conditions exist.  Outdoor molds grow on  plants, decaying vegetation and soil. The major outdoor mold, Alternaria and Cladosporium, are found in very high numbers during hot and dry conditions. Generally, a late summer - fall peak is seen for common outdoor fungal spores. Rain will temporarily lower outdoor mold spore  count, but counts rise rapidly when the rainy period ends. The most important indoor molds are Aspergillus and Penicillium. Dark, humid and poorly ventilated basements are ideal sites for mold growth. The next most common sites of mold growth are the bathroom and the kitchen. Outdoor (Seasonal) Mold Control Use air conditioning and keep windows closed. Avoid exposure to decaying vegetation. Avoid leaf raking. Avoid grain handling. Consider wearing a face mask if working in moldy areas.  Indoor (Perennial) Mold Control  Maintain humidity below 50%. Get rid of mold growth on hard surfaces with water, detergent and, if necessary, 5% bleach (do not mix with other cleaners). Then dry the area completely. If mold covers an area more than 10 square feet, consider hiring an indoor environmental professional. For clothing, washing with soap and water is best. If moldy items cannot be cleaned and dried, throw them away. Remove sources e.g. contaminated carpets. Repair and seal leaking roofs or pipes. Using dehumidifiers in damp basements may be helpful, but empty the water and clean units regularly to prevent mildew from forming. All rooms, especially basements, bathrooms and kitchens, require ventilation and cleaning to deter mold and mildew growth. Avoid carpeting on concrete or damp floors, and storing items in damp areas. Control of House Dust Mite Allergen Dust mite allergens are a common trigger of allergy and asthma symptoms. While they can be found throughout the house, these microscopic creatures thrive in warm, humid environments such as bedding, upholstered furniture and carpeting. Because so much time is spent in the bedroom, it is essential to reduce mite levels there.  Encase pillows, mattresses, and box springs in special allergen-proof fabric covers or airtight, zippered plastic covers.  Bedding should be washed weekly in hot water (130 F) and dried in a hot dryer. Allergen-proof covers are  available for comforters and pillows that can't be regularly washed.  Wash the allergy-proof covers every few months. Minimize clutter in the bedroom. Keep pets out of the bedroom.  Keep humidity less than 50% by using a dehumidifier or air conditioning. You can buy a humidity measuring device called a hygrometer to monitor this.  If possible, replace carpets with hardwood, linoleum, or washable area rugs. If that's not possible, vacuum frequently with a vacuum that has a HEPA filter. Remove all upholstered furniture and non-washable window drapes from the bedroom. Remove all non-washable stuffed toys from the bedroom.  Wash stuffed toys weekly. Pet Allergen Avoidance: Contrary to popular opinion, there are no "hypoallergenic" breeds of dogs or cats. That is because people are not allergic to an animal's hair, but to an allergen found in the animal's saliva, dander (dead skin flakes) or urine. Pet allergy symptoms typically occur within minutes. For some people, symptoms can build up and become most severe 8 to 12 hours after contact with the animal. People with severe allergies can experience reactions in public places if dander has been transported on the pet owners' clothing. Keeping an animal outdoors is only a partial solution, since homes with pets in the yard still have higher concentrations of animal allergens. Before getting a pet, ask your allergist to determine if you are allergic to animals. If your pet is already considered part of your family, try to minimize contact  and keep the pet out of the bedroom and other rooms where you spend a great deal of time. As with dust mites, vacuum carpets often or replace carpet with a hardwood floor, tile or linoleum. High-efficiency particulate air (HEPA) cleaners can reduce allergen levels over time. While dander and saliva are the source of cat and dog allergens, urine is the source of allergens from rabbits, hamsters, mice and [place] pigs; so ask a  non-allergic family member to clean the animal's cage. If you have a pet allergy, talk to your allergist about the potential for allergy immunotherapy (allergy shots). This strategy can often provide long-term relief.

## 2023-07-09 ENCOUNTER — Encounter (HOSPITAL_COMMUNITY): Payer: Self-pay

## 2023-07-09 ENCOUNTER — Ambulatory Visit (HOSPITAL_COMMUNITY)
Admission: RE | Admit: 2023-07-09 | Discharge: 2023-07-09 | Disposition: A | Discharge status: Home / Self Care | Source: Ambulatory Visit | Attending: Family Medicine | Admitting: Family Medicine

## 2023-07-09 ENCOUNTER — Other Ambulatory Visit (HOSPITAL_COMMUNITY): Payer: Self-pay | Admitting: Family Medicine

## 2023-07-09 ENCOUNTER — Other Ambulatory Visit (HOSPITAL_COMMUNITY): Payer: Self-pay

## 2023-07-09 VITALS — BP 120/83 | HR 97

## 2023-07-09 DIAGNOSIS — I82622 Acute embolism and thrombosis of deep veins of left upper extremity: Secondary | ICD-10-CM

## 2023-07-09 DIAGNOSIS — M7989 Other specified soft tissue disorders: Secondary | ICD-10-CM

## 2023-07-09 MED ORDER — APIXABAN (ELIQUIS) VTE STARTER PACK (10MG AND 5MG)
ORAL_TABLET | ORAL | 0 refills | Status: DC
  Filled 2023-07-09: qty 74, 30d supply, fill #0

## 2023-07-09 MED ORDER — APIXABAN 5 MG PO TABS: 5.0000 mg | ORAL_TABLET | Freq: Two times a day (BID) | ORAL | 1 refills | Status: AC

## 2023-07-09 NOTE — Progress Notes (Signed)
 LUE venous duplex has been completed.  Per instructions on order, patient sent to DVT clinic for further treatment.   Results can be found under chart review under CV PROC. 07/09/2023 1:44 PM Jayonna Meyering RVT, RDMS

## 2023-07-09 NOTE — Progress Notes (Signed)
 DVT Clinic Note  Name: Patty Mccarthy     MRN: [id number]     DOB: Jul 17, 2000     Sex: female  PCP: Helena Loach  Today's Visit: Visit Information: Initial Visit  Referred to DVT Clinic by: Primary Care - Dr. Daivd Dub Referred to CPP by: Dr. Susi Eric Reason for referral:  Chief Complaint  Patient presents with   DVT   HISTORY OF PRESENT ILLNESS: Patty Mccarthy is a 23 y.o. female with PMH PCOS, asthma, allergies, who presents after diagnosis of DVT for medication management. She presented to urgent care 07/08/23 reporting left arm pain and swelling. DVT study today showed acute DVT in one of the paired left brachial veins. No prior history of VTE. She is s/p ovarian cystectomy 06/04/23. Per patient, she had multiple IV's and lab draws in the left arm during and after the surgery. She was recently started on a combined oral contraceptive in January. Last pregnancy test 06/04/23 was negative. She has not been sexually active in 2025. She has been having some uterine/menstrual bleeding since February and after her recent surgery which she says was expected and should start to lighten. She tells me today that she had some pain that came and went in her left arm a few times after surgery but it started again 5 days ago and continued to worsen. Her arm is also swollen and she is not able to completely straighten it.   Positive Thrombotic Risk Factors: Recent surgery (within 3 months), Bed rest >72 hours within 3 month, Estrogen therapy Bleeding Risk Factors: None Present  Negative Thrombotic Risk Factors: Previous VTE, Recent trauma (within 3 months), Recent admission to hospital with acute illness (within 3 months), Paralysis, paresis, or recent plaster cast immobilization of lower extremity, Central venous catheterization, Sedentary journey lasting >8 hours within 4 weeks, Pregnancy, Within 6 weeks postpartum, Recent cesarean section (within 3 months), Testosterone therapy,  Erythropoiesis-stimulating agent, Recent COVID diagnosis (within 3 months), Active cancer, Non-malignant, chronic inflammatory condition, Known thrombophilic condition, Smoking, Obesity, Older age  Rx Insurance Coverage: Commercial Rx Affordability: Eliquis is $0/month as she has met her out of pocket max this year.  Preferred Pharmacy: Starter pack filled at on site [facility]. [facility]   Past Medical History:  Diagnosis Date   ADD (attention deficit disorder)    Asthma    Eczema    PCOS (polycystic ovarian syndrome)     Past Surgical History:  Procedure Laterality Date   ADENOIDECTOMY     LAPAROSCOPIC OVARIAN CYSTECTOMY Left    TONSILLECTOMY     WISDOM TOOTH EXTRACTION      Social History   Socioeconomic History   Marital status: Single    Spouse name: Not on file   Number of children: Not on file   Years of education: Not on file   Highest education level: Not on file  Occupational History   Not on file  Tobacco Use   Smoking status: Never    Passive exposure: Never   Smokeless tobacco: Never  Vaping Use   Vaping status: Never Used  Substance and Sexual Activity   Alcohol use: Never   Drug use: Never   Sexual activity: Not on file  Other Topics Concern   Not on file  Social History Narrative   Not on file   Social Drivers of Health   Financial Resource Strain: Patient Declined (06/21/2023)   Received from [facility]   Overall Financial Resource Strain (CARDIA)  Difficulty of Paying Living Expenses: Patient declined  Recent Concern: Financial Resource Strain - High Risk (03/23/2023)   Received from [facility]   Overall Financial Resource Strain (CARDIA)    Difficulty of Paying Living Expenses: Hard  Food Insecurity: Patient Declined (06/21/2023)   Received from [facility]   Hunger Vital Sign    Worried About Running Out of Food in the Last Year: Patient declined    Ran Out of Food in the Last Year: Patient  declined  Recent Concern: Food Insecurity - Food Insecurity Present (03/23/2023)   Received from [facility]   Hunger Vital Sign    Worried About Running Out of Food in the Last Year: Sometimes true    Ran Out of Food in the Last Year: Sometimes true  Transportation Needs: Patient Declined (06/21/2023)   Received from [facility] - Transportation    Lack of Transportation (Medical): Patient declined    Lack of Transportation (Non-Medical): Patient declined  Physical Activity: Unknown (06/21/2023)   Received from [facility]   Exercise Vital Sign    Days of Exercise per Week: Patient declined    Minutes of Exercise per Session: 20 min  Recent Concern: Physical Activity - Insufficiently Active (03/23/2023)   Received from [facility]   Exercise Vital Sign    Days of Exercise per Week: 3 days    Minutes of Exercise per Session: 20 min  Stress: Patient Declined (06/21/2023)   Received from [facility] - Occupational Stress Questionnaire    Feeling of Stress : Patient declined  Social Connections: Patient Declined (06/21/2023)   Received from [facility]   Social Network    How would you rate your social network (family, work, friends)?: Patient declined  Recent Concern: Social Connections - Somewhat Isolated (03/23/2023)   Received from [facility]   Social Network    How would you rate your social network (family, work, friends)?: Restricted participation with some degree of social isolation  Intimate Partner Violence: Not At Risk (06/21/2023)   Received from [facility]   HITS    Over the last 12 months how often did your partner physically hurt you?: Never    Over the last 12 months how often did your partner insult you or talk down to you?: Never    Over the last 12 months how often did your partner threaten you with physical harm?: Never    Over the last 12 months how often did your partner scream or curse at  you?: Never    Family History  Problem Relation Age of Onset   Urticaria Mother    Asthma Mother    Eczema Sister    Asthma Sister     Allergies as of 07/09/2023 - Review Complete 07/09/2023  Allergen Reaction Noted   Doxycycline hyclate Nausea And Vomiting 05/19/2023   Prednisone Hives 05/19/2023    Current Outpatient Medications on File Prior to Encounter  Medication Sig Dispense Refill   acetaminophen (TYLENOL) 650 MG CR tablet Take 650 mg by mouth every 8 (eight) hours as needed.     amphetamine-dextroamphetamine (ADDERALL) 5 MG tablet Take 1 tablet by mouth daily.     B Complex-C (SUPER B COMPLEX/VITAMIN C PO) Take 1 tablet by mouth daily.     budesonide-formoterol (SYMBICORT) 80-4.5 MCG/ACT inhaler Inhale 2 puffs into the lungs in the morning and at bedtime. with spacer and rinse mouth afterwards. 1 each 3   cetirizine (  ZYRTEC) 10 MG chewable tablet Chew by mouth.     lamoTRIgine (LAMICTAL) 150 MG tablet Take 150 mg by mouth daily.     lisdexamfetamine (VYVANSE) 50 MG capsule Take 50 mg by mouth every morning.     norgestimate-ethinyl estradiol (ORTHO-CYCLEN) 0.25-35 MG-MCG tablet Take by mouth.     sertraline (ZOLOFT) 100 MG tablet Take 100 mg by mouth daily.     UNABLE TO FIND Take 80 mg by mouth daily. Med Name: Silexan (lavender extract)     albuterol (ACCUNEB) 0.63 MG/3ML nebulizer solution Take 3 mLs (0.63 mg dose) by nebulization every 6 (six) hours as needed for Wheezing.     albuterol (VENTOLIN HFA) 108 (90 Base) MCG/ACT inhaler Inhale 2 puffs into the lungs every 4 (four) hours as needed.     Spacer/Aero-Holding Chambers (AEROCHAMBER MV) inhaler Use as instructed 1 each 2   No current facility-administered medications on file prior to encounter.   REVIEW OF SYSTEMS:  Review of Systems  Respiratory:  Negative for shortness of breath.   Cardiovascular:  Negative for chest pain and palpitations.  Musculoskeletal:  Positive for myalgias.  Neurological:  Positive for  tingling. Negative for dizziness.   PHYSICAL EXAMINATION:  Vitals:   07/09/23 1529  BP: 120/83  Pulse: 97  SpO2: 97%   Physical Exam Vitals reviewed.  Cardiovascular:     Rate and Rhythm: Normal rate.  Pulmonary:     Effort: Pulmonary effort is normal.  Musculoskeletal:        General: Swelling (left arm) and tenderness present.  Skin:    Findings: Erythema present. No bruising.  Psychiatric:        Mood and Affect: Mood normal.        Behavior: Behavior normal.        Thought Content: Thought content normal.   LABS:  CBC     Component Value Date/Time   WBC 9.1 05/19/2023 0552   RBC 4.40 05/19/2023 0552   HGB 12.8 05/19/2023 0552   HCT 38.2 05/19/2023 0552   PLT 345 05/19/2023 0552   MCV 86.8 05/19/2023 0552   MCH 29.1 05/19/2023 0552   MCHC 33.5 05/19/2023 0552   RDW 12.4 05/19/2023 0552   LYMPHSABS 4.4 (H) 05/19/2023 0552   MONOABS 0.4 05/19/2023 0552   EOSABS 0.3 05/19/2023 0552   BASOSABS 0.1 05/19/2023 0552   Renal Function   Lab Results  Component Value Date   CREATININE 0.89 05/19/2023    CrCl cannot be calculated (Patient's most recent lab result is older than the maximum 21 days allowed.).   VVS Vascular Lab Studies:  07/09/23 VAS [place] UPPER EXTREMITY VENOUS DUPLEX LEFT  Summary:    Right:  No evidence of thrombosis in the subclavian.    Left:  No evidence of superficial vein thrombosis in the upper extremity.  Findings consistent with acute deep vein thrombosis involving one of the left brachial veins.   ASSESSMENT: Location of DVT: Left upper extremity Cause of DVT: provoked by a transient risk factor  Patient without prior history of DVT diagnosed with an acute DVT in one of the left brachial veins. She is s/p ovarian cystectomy on 06/04/23. Her IV's for this were placed in the left arm. Also reports multiple lab draws in the left arm. She began to have pain in the left arm shortly after surgery but it began to worsen and start swelling over the  last 5 days. She was also recently started on a combined oral contraceptive in  January. Recent initiation of estrogen therapy is a risk factor for DVT as well. She has an appointment with her gynecologist on Friday for surgery follow up and she is aware she needs to discuss her DVT diagnosis with the provider to discuss any necessary changes in birth control in light of estrogen playing a role in this DVT. She is also currently experiencing uterine/menstrual bleeding since February that continues after her recent surgery. Discussed that anticoagulation may worsen this bleeding. Started anticoagulation with Eliquis. If all risk factors are resolved, will plan to treat a first provoked DVT for 3 months. She has not been sexually active in 2025, and last pregnancy test 06/04/23 before her surgery was negative, We discussed the need to prevent pregnancy while on Eliquis, and she confirmed understanding. Counseled patient extensively on Eliquis, and all questions have been answered at this time. The starter pack was filled during her visit today, and refills were sent to her preferred pharmacy. No concerns related to medication access or affordability.   PLAN: -Start apixaban (Eliquis) 10 mg twice daily for 7 days followed by 5 mg twice daily. -Expected duration of therapy: 3 months. Therapy started on 07/09/23. -Patient educated on purpose, proper use and potential adverse effects of apixaban (Eliquis). -Discussed importance of taking medication around the same time every day. -Advised patient of medications to avoid (NSAIDs, aspirin doses >100 mg daily). -Educated that Tylenol (acetaminophen) is the preferred analgesic to lower the risk of bleeding. -Advised patient to alert all providers of anticoagulation therapy prior to starting a new medication or having a procedure. -Emphasized importance of monitoring for signs and symptoms of bleeding (abnormal bruising, prolonged bleeding, nose bleeds, bleeding from  gums, discolored urine, black tarry stools). -Educated patient to present to the ED if emergent signs and symptoms of new thrombosis occur.  -Elevate arm to help with swelling.   Follow up: Gynecology appt on Friday. DVT Clinic in 3 months.   Faye Hoops, PharmD, [place], CPP Deep Vein Thrombosis Clinic Clinical Pharmacist Practitioner

## 2023-07-09 NOTE — Patient Instructions (Addendum)
-  Start apixaban (Eliquis) 10 mg twice daily for 7 days followed by 5 mg twice daily. -[facility]. You may need to call the pharmacy to ask them to fill this when you start to run low on your current supply.  -It is important to take your medication around the same time every day.  -Avoid NSAIDs like ibuprofen (Advil, Motrin) and naproxen (Aleve) as well as aspirin doses over 100 mg daily. -Tylenol (acetaminophen) is the preferred over the counter pain medication to lower the risk of bleeding. -Be sure to alert all of your health care providers that you are taking an anticoagulant prior to starting a new medication or having a procedure. -Monitor for signs and symptoms of bleeding (abnormal bruising, prolonged bleeding, nose bleeds, bleeding from gums, discolored urine, black tarry stools). If you have fallen and hit your head OR if your bleeding is severe or not stopping, seek emergency care.  -Go to the emergency room if emergent signs and symptoms of new clot occur (new or worse swelling and pain in an arm or leg, shortness of breath, chest pain, fast or irregular heartbeats, lightheadedness, dizziness, fainting, coughing up blood) or if you experience a significant color change (pale or blue) in the extremity that has the DVT.   Your next visit is on Thursday, July 10th at 10am.   **As of July 22, 2023, our location is changing** The DVT Clinic will be located at [facility] at:  [address], [place], [place] [address] Phone number as of 07/22/23 will be [phone].   If you have any questions or need to reschedule an appointment, please call [phone] [facility] (active number until 07/22/23).  If you are having an emergency, call 911 or present to the nearest emergency room.   What is a DVT?  -Deep vein thrombosis (DVT) is a condition in which a blood clot forms in a vein of the deep venous system which can occur in the  lower leg, thigh, pelvis, arm, or neck. This condition is serious and can be life-threatening if the clot travels to the arteries of the lungs and causing a blockage (pulmonary embolism, PE). A DVT can also damage veins in the leg, which can lead to long-term venous disease, leg pain, swelling, discoloration, and ulcers or sores (post-thrombotic syndrome).  -Treatment may include taking an anticoagulant medication to prevent more clots from forming and the current clot from growing, wearing compression stockings, and/or surgical procedures to remove or dissolve the clot.

## 2023-09-15 NOTE — Progress Notes (Unsigned)
 Follow Up Note  RE: Patty Mccarthy MRN: [id number] DOB: Aug 04, 2000 Date of Office Visit: 09/16/2023  Referring provider: Vaughn Lauraine KATHEE DEVONNA Primary care provider: Vaughn Lauraine KATHEE, PA-C  Chief Complaint: No chief complaint on file.  History of Present Illness: I had the pleasure of seeing Patty Mccarthy for a follow up visit at the [facility] on 09/16/2023. She is a 23 y.o. female, who is being followed for allergic rhinitis, recurrent infections, asthma, food allergy , adverse food reactions. Her previous allergy  office visit was on 07/08/2023 with Dr. Luke. Today is a regular follow up visit.  Discussed the use of AI scribe software for clinical note transcription with the patient, who gave verbal consent to proceed.  History of Present Illness            ***  Assessment and Plan: Patty Mccarthy is a 23 y.o. female with: Other allergic rhinitis Past history - perennial symptoms. Nasal sprays cause epistaxis.  Today's skin testing positive to grass, weed, trees, mold, dust mites, horse, cat. Borderline to tobacco and dog.  Start environmental control measures as below. Use over the counter antihistamines such as Zyrtec (cetirizine), Claritin (loratadine), Allegra (fexofenadine), or Xyzal (levocetirizine) daily as needed. May take twice a day during allergy  flares. May switch antihistamines every few months. Consider allergy  injections for long term control if above medications do not help the symptoms - handout given.    Adverse effect of other drugs, medicaments and biological substances, subsequent encounter Past history - reported hives after prednisone use for asthma exacerbation due to pneumonia in 2015. Tolerated Flonase and Wixela in the past.  Low likelihood as tolerating steroid containing inhalers and nasal sprays with no issues in the past. If interested we can schedule drug challenge to prednisolone.    Recurrent infections Past history - multiple  antibiotic courses for respiratory infections.  Keep track of infections and antibiotics use. If persistent will get bloodwork next to look at immune system.    Moderate persistent asthma Past history - exacerbated by illness, exercise, and seasonal changes. Used to be on [employer]. Uses albuterol few times per week with good benefit. 2025 spirometry was unremarkable. Daily controller medication(s): Symbicort  80mcg 2 puffs twice a day with spacer and rinse mouth afterwards. May use albuterol rescue inhaler 2 puffs or nebulizer every 4 to 6 hours as needed for shortness of breath, chest tightness, coughing, and wheezing. May use albuterol rescue inhaler 2 puffs 5 to 15 minutes prior to strenuous physical activities. Monitor frequency of use - if you need to use it more than twice per week on a consistent basis let us  know.     Other adverse food reactions, not elsewhere classified, subsequent encounter Oral allergy  syndrome, subsequent encounter Past history - resolved childhood food allergies to milk, egg and chicken. Current oral allergy  symptoms with certain fruits.  Today's skin prick testing negative to select foods. Continue to avoid foods that are bothersome - strawberries, pineapples, oranges, lemons. No need to test for foods that you are eating without any issues. For mild symptoms you can take over the counter antihistamines such as Benadryl 1-2 tablets = 25-50mg  and monitor symptoms closely.  If symptoms worsen or if you have severe symptoms including breathing issues, throat closure, significant swelling, whole body hives, severe diarrhea and vomiting, lightheadedness then seek immediate medical care..    Assessment and Plan              No follow-ups on file.  No orders of the defined types were placed in this encounter.  Lab Orders  No laboratory test(s) ordered today    Diagnostics: Spirometry:  Tracings reviewed. Her effort: {Blank single:19197::Good reproducible  efforts.,It was hard to get consistent efforts and there is a question as to whether this reflects a maximal maneuver.,Poor effort, data can not be interpreted.} FVC: ***L FEV1: ***L, ***% predicted FEV1/FVC ratio: ***% Interpretation: {Blank single:19197::Spirometry consistent with mild obstructive disease,Spirometry consistent with moderate obstructive disease,Spirometry consistent with severe obstructive disease,Spirometry consistent with possible restrictive disease,Spirometry consistent with mixed obstructive and restrictive disease,Spirometry uninterpretable due to technique,Spirometry consistent with normal pattern,No overt abnormalities noted given today's efforts}.  Please see scanned spirometry results for details.  Skin Testing: {Blank single:19197::Select foods,Environmental allergy  panel,Environmental allergy  panel and select foods,Food allergy  panel,None,Deferred due to recent antihistamines use}. *** Results discussed with patient/family.   Medication List:  Current Outpatient Medications  Medication Sig Dispense Refill  . acetaminophen  (TYLENOL ) 650 MG CR tablet Take 650 mg by mouth every 8 (eight) hours as needed.    SABRA albuterol (ACCUNEB) 0.63 MG/3ML nebulizer solution Take 3 mLs (0.63 mg dose) by nebulization every 6 (six) hours as needed for Wheezing.    SABRA albuterol (VENTOLIN HFA) 108 (90 Base) MCG/ACT inhaler Inhale 2 puffs into the lungs every 4 (four) hours as needed.    SABRA amphetamine-dextroamphetamine (ADDERALL) 5 MG tablet Take 1 tablet by mouth daily.    . apixaban  (ELIQUIS ) 5 MG TABS tablet Take 1 tablet (5 mg total) by mouth 2 (two) times daily. Start taking after completion of starter pack. 60 tablet 1  . APIXABAN  (ELIQUIS ) VTE STARTER PACK (10MG  AND 5MG ) Take as directed on package: start with two-5mg  tablets twice daily for 7 days. On day 8, switch to one-5mg  tablet twice daily. 74 each 0  . B Complex-C (SUPER B COMPLEX/VITAMIN C PO)  Take 1 tablet by mouth daily.    . budesonide -formoterol  (SYMBICORT ) 80-4.5 MCG/ACT inhaler Inhale 2 puffs into the lungs in the morning and at bedtime. with spacer and rinse mouth afterwards. 1 each 3  . cetirizine (ZYRTEC) 10 MG chewable tablet Chew by mouth.    . lamoTRIgine (LAMICTAL) 150 MG tablet Take 150 mg by mouth daily.    SABRA lisdexamfetamine (VYVANSE) 50 MG capsule Take 50 mg by mouth every morning.    . norgestimate-ethinyl estradiol (ORTHO-CYCLEN) 0.25-35 MG-MCG tablet Take by mouth.    . sertraline (ZOLOFT) 100 MG tablet Take 100 mg by mouth daily.    SABRA Spacer/Aero-Holding Chambers (AEROCHAMBER MV) inhaler Use as instructed 1 each 2  . UNABLE TO FIND Take 80 mg by mouth daily. Med Name: Silexan (lavender extract)     No current facility-administered medications for this visit.   Allergies: Allergies  Allergen Reactions  . Doxycycline Hyclate Nausea And Vomiting  . Prednisone Hives   I reviewed her past medical history, social history, family history, and environmental history and no significant changes have been reported from her previous visit.  Review of Systems  Constitutional:  Negative for appetite change, chills, fever and unexpected weight change.  HENT:  Negative for congestion and rhinorrhea.   Eyes:  Negative for itching.  Respiratory:  Positive for cough, chest tightness, shortness of breath and wheezing.   Cardiovascular:  Negative for chest pain.  Gastrointestinal:  Positive for abdominal pain. Negative for constipation, diarrhea, nausea and vomiting.  Genitourinary:  Negative for difficulty urinating.  Skin:  Negative for rash.  Allergic/Immunologic: Positive for environmental allergies.  Neurological:  Negative for headaches.   Objective: There were no vitals taken for this visit. There is no height or weight on file to calculate BMI. Physical Exam Vitals and nursing note reviewed.  Constitutional:      Appearance: Normal appearance. She is  well-developed.  HENT:     Head: Normocephalic and atraumatic.     Right Ear: Tympanic membrane and external ear normal.     Left Ear: Tympanic membrane and external ear normal.     Nose: Nose normal.     Mouth/Throat:     Mouth: Mucous membranes are moist.     Pharynx: Oropharynx is clear.   Eyes:     Conjunctiva/sclera: Conjunctivae normal.    Cardiovascular:     Rate and Rhythm: Normal rate and regular rhythm.     Heart sounds: Normal heart sounds. No murmur heard.    No friction rub. No gallop.  Pulmonary:     Effort: Pulmonary effort is normal.     Breath sounds: Normal breath sounds. No wheezing, rhonchi or rales.   Musculoskeletal:     Cervical back: Neck supple.   Skin:    General: Skin is warm.     Findings: No rash.   Neurological:     Mental Status: She is alert and oriented to person, place, and time.   Psychiatric:        Behavior: Behavior normal.  Previous notes and tests were reviewed. The plan was reviewed with the patient/family, and all questions/concerned were addressed.  It was my pleasure to see Patty Mccarthy today and participate in her care. Please feel free to contact me with any questions or concerns.  Sincerely,  Orlan Cramp, DO Allergy  & Immunology  [facility]  [place] office: [phone] [facility] office: [phone]

## 2023-09-16 ENCOUNTER — Ambulatory Visit (INDEPENDENT_AMBULATORY_CARE_PROVIDER_SITE_OTHER): Admitting: Allergy

## 2023-09-16 ENCOUNTER — Other Ambulatory Visit: Payer: Self-pay

## 2023-09-16 ENCOUNTER — Encounter: Payer: Self-pay | Admitting: Allergy

## 2023-09-16 VITALS — BP 124/80 | HR 89 | Temp 97.7°F | Resp 18 | Ht 63.78 in | Wt 183.0 lb

## 2023-09-16 DIAGNOSIS — J3089 Other allergic rhinitis: Secondary | ICD-10-CM | POA: Diagnosis not present

## 2023-09-16 DIAGNOSIS — T781XXD Other adverse food reactions, not elsewhere classified, subsequent encounter: Secondary | ICD-10-CM | POA: Diagnosis not present

## 2023-09-16 DIAGNOSIS — Z888 Allergy status to other drugs, medicaments and biological substances status: Secondary | ICD-10-CM

## 2023-09-16 DIAGNOSIS — J302 Other seasonal allergic rhinitis: Secondary | ICD-10-CM

## 2023-09-16 DIAGNOSIS — T50995D Adverse effect of other drugs, medicaments and biological substances, subsequent encounter: Secondary | ICD-10-CM

## 2023-09-16 DIAGNOSIS — B999 Unspecified infectious disease: Secondary | ICD-10-CM | POA: Diagnosis not present

## 2023-09-16 DIAGNOSIS — J4540 Moderate persistent asthma, uncomplicated: Secondary | ICD-10-CM | POA: Diagnosis not present

## 2023-09-16 MED ORDER — BUDESONIDE-FORMOTEROL FUMARATE 80-4.5 MCG/ACT IN AERO: 2.0000 | INHALATION_SPRAY | Freq: Two times a day (BID) | RESPIRATORY_TRACT | 5 refills | Status: DC

## 2023-09-16 MED ORDER — AEROCHAMBER MV MISC: 2 refills | Status: AC

## 2023-09-16 NOTE — Patient Instructions (Addendum)
 Environmental allergies 2025 skin testing positive to grass, weed, trees, mold, dust mites, horse, cat. Borderline to tobacco and dog.  Continue environmental control measures as below. Take Zyrtec (cetirizine) 10mg  daily as needed. May take twice a day during allergy  flares.  Use refresh eye drops as needed. Nasal saline spray (i.e., Simply Saline) or nasal saline lavage (i.e., NeilMed) is recommended as needed and prior to medicated nasal sprays. Consider allergy  injections for long term control if above medications do not help the symptoms.  Asthma Daily controller medication(s): Symbicort  80mcg 2 puffs twice a day with spacer and rinse mouth afterwards. May use albuterol rescue inhaler 2 puffs or nebulizer every 4 to 6 hours as needed for shortness of breath, chest tightness, coughing, and wheezing. May use albuterol rescue inhaler 2 puffs 5 to 15 minutes prior to strenuous physical activities. Monitor frequency of use - if you need to use it more than twice per week on a consistent basis let us  know.  Breathing control goals:  Full participation in all desired activities (may need albuterol before activity) Albuterol use two times or less a week on average (not counting use with activity) Cough interfering with sleep two times or less a month Oral steroids no more than once a year No hospitalizations   Foods Continue to avoid foods that are bothersome - strawberries, pineapples, oranges, lemons. No need to test for foods that you are eating without any issues. For mild symptoms you can take over the counter antihistamines (zyrtec 10mg  to 20mg ) and monitor symptoms closely.  If symptoms worsen or if you have severe symptoms including breathing issues, throat closure, significant swelling, whole body hives, severe diarrhea and vomiting, lightheadedness then seek immediate medical care.  Emergency action plan in place.   Infections Keep track of infections and antibiotics use. If  persistent will get bloodwork next to look at immune system.   Prednisone allergy ? Low likelihood as you tolerates steroid containing inhalers and nasal sprays with no issues in the past.  If interested we can schedule drug challenge to prednisolone.  Drug challenge instructions: You must be off antihistamines for 3-5 days before. Must be in good health and not ill. No vaccines/injections/antibiotics within the past 7 days.  Plan on being in the office for 2-3 hours and must bring in the drug you want to do the oral challenge for - will send in prescription to pick up a few days before.  You must call to schedule an appointment and specify it's for a drug challenge.   Follow up in 4 months or sooner if needed.   Reducing Pollen Exposure Pollen seasons: trees (spring), grass (summer) and ragweed/weeds (fall). Keep windows closed in your home and car to lower pollen exposure.  Install air conditioning in the bedroom and throughout the house if possible.  Avoid going out in dry windy days - especially early morning. Pollen counts are highest between 5 - 10 AM and on dry, hot and windy days.  Save outside activities for late afternoon or after a heavy rain, when pollen levels are lower.  Avoid mowing of grass if you have grass pollen allergy . Be aware that pollen can also be transported indoors on people and pets.  Dry your clothes in an automatic dryer rather than hanging them outside where they might collect pollen.  Rinse hair and eyes before bedtime. Mold Control Mold and fungi can grow on a variety of surfaces provided certain temperature and moisture conditions exist.  Outdoor molds grow  on plants, decaying vegetation and soil. The major outdoor mold, Alternaria and Cladosporium, are found in very high numbers during hot and dry conditions. Generally, a late summer - fall peak is seen for common outdoor fungal spores. Rain will temporarily lower outdoor mold spore count, but counts rise  rapidly when the rainy period ends. The most important indoor molds are Aspergillus and Penicillium. Dark, humid and poorly ventilated basements are ideal sites for mold growth. The next most common sites of mold growth are the bathroom and the kitchen. Outdoor (Seasonal) Mold Control Use air conditioning and keep windows closed. Avoid exposure to decaying vegetation. Avoid leaf raking. Avoid grain handling. Consider wearing a face mask if working in moldy areas.  Indoor (Perennial) Mold Control  Maintain humidity below 50%. Get rid of mold growth on hard surfaces with water, detergent and, if necessary, 5% bleach (do not mix with other cleaners). Then dry the area completely. If mold covers an area more than 10 square feet, consider hiring an indoor environmental professional. For clothing, washing with soap and water is best. If moldy items cannot be cleaned and dried, throw them away. Remove sources e.g. contaminated carpets. Repair and seal leaking roofs or pipes. Using dehumidifiers in damp basements may be helpful, but empty the water and clean units regularly to prevent mildew from forming. All rooms, especially basements, bathrooms and kitchens, require ventilation and cleaning to deter mold and mildew growth. Avoid carpeting on concrete or damp floors, and storing items in damp areas. Control of House Dust Mite Allergen Dust mite allergens are a common trigger of allergy  and asthma symptoms. While they can be found throughout the house, these microscopic creatures thrive in warm, humid environments such as bedding, upholstered furniture and carpeting. Because so much time is spent in the bedroom, it is essential to reduce mite levels there.  Encase pillows, mattresses, and box springs in special allergen-proof fabric covers or airtight, zippered plastic covers.  Bedding should be washed weekly in hot water (130 F) and dried in a hot dryer. Allergen-proof covers are available for  comforters and pillows that can't be regularly washed.  Wash the allergy -proof covers every few months. Minimize clutter in the bedroom. Keep pets out of the bedroom.  Keep humidity less than 50% by using a dehumidifier or air conditioning. You can buy a humidity measuring device called a hygrometer to monitor this.  If possible, replace carpets with hardwood, linoleum, or washable area rugs. If that's not possible, vacuum frequently with a vacuum that has a HEPA filter. Remove all upholstered furniture and non-washable window drapes from the bedroom. Remove all non-washable stuffed toys from the bedroom.  Wash stuffed toys weekly. Pet Allergen Avoidance: Contrary to popular opinion, there are no "hypoallergenic" breeds of dogs or cats. That is because people are not allergic to an animal's hair, but to an allergen found in the animal's saliva, dander (dead skin flakes) or urine. Pet allergy  symptoms typically occur within minutes. For some people, symptoms can build up and become most severe 8 to 12 hours after contact with the animal. People with severe allergies can experience reactions in public places if dander has been transported on the pet owners' clothing. Keeping an animal outdoors is only a partial solution, since homes with pets in the yard still have higher concentrations of animal allergens. Before getting a pet, ask your allergist to determine if you are allergic to animals. If your pet is already considered part of your family, try to minimize  contact and keep the pet out of the bedroom and other rooms where you spend a great deal of time. As with dust mites, vacuum carpets often or replace carpet with a hardwood floor, tile or linoleum. High-efficiency particulate air (HEPA) cleaners can reduce allergen levels over time. While dander and saliva are the source of cat and dog allergens, urine is the source of allergens from rabbits, hamsters, mice and israel pigs; so ask a non-allergic  family member to clean the animal's cage. If you have a pet allergy , talk to your allergist about the potential for allergy  immunotherapy (allergy  shots). This strategy can often provide long-term relief.

## 2023-10-03 ENCOUNTER — Ambulatory Visit: Attending: Vascular Surgery | Admitting: Student-PharmD

## 2023-10-03 ENCOUNTER — Encounter: Payer: Self-pay | Admitting: Student-PharmD

## 2023-10-03 DIAGNOSIS — I82622 Acute embolism and thrombosis of deep veins of left upper extremity: Secondary | ICD-10-CM

## 2023-10-03 NOTE — Progress Notes (Addendum)
 DVT Clinic Note  Name: Patty Mccarthy     MRN: [id number]     DOB: [birth year]     Sex: female  PCP: Vaughn Lauraine KATHEE DEVONNA  Today's Visit: Visit Information: Initial Visit  Referred to DVT Clinic by: Primary Care - Dr. Pura Referred to CPP by: Dr. Lanis Reason for referral:  Chief Complaint  Patient presents with   Med Management - DVT   HISTORY OF PRESENT ILLNESS: Patty Mccarthy is a 23 y.o. female with PMH PCOS, asthma, allergies, who presents for follow up medication management for DVT. She presented to urgent care 07/08/23 reporting left arm pain and swelling. DVT study today showed acute DVT in one of the paired left brachial veins. No prior history of VTE. She was s/p ovarian cystectomy 06/04/23. Per patient, she had multiple IV's and lab draws in the left arm during and after the surgery. She had also recently been started on a combined oral contraceptive in January. Last seen in DVT Clinic 07/09/23 at which time Eliquis  was started with plans to treat a first provoked DVT for 3 months. Today, patient reports that the swelling in her arm resolved about 1 week of starting Eliquis . Denies abnormal bleeding or bruising. Denies missed doses of Eliquis . Her gynecologist discontinued the COC and switched to a progestin only pill but she had bleeding with this. Then received Depo-Provera IM injection but patient does not plan to continue with this.   Positive Thrombotic Risk Factors: Recent surgery (within 3 months), Bed rest >72 hours within 3 month, Estrogen therapy Bleeding Risk Factors: Anticoagulant therapy  Negative Thrombotic Risk Factors: Previous VTE, Recent trauma (within 3 months), Recent admission to hospital with acute illness (within 3 months), Paralysis, paresis, or recent plaster cast immobilization of lower extremity, Central venous catheterization, Sedentary journey lasting >8 hours within 4 weeks, Pregnancy, Within 6 weeks postpartum, Recent cesarean section (within 3 months),  Testosterone therapy, Erythropoiesis-stimulating agent, Recent COVID diagnosis (within 3 months), Active cancer, Non-malignant, chronic inflammatory condition, Known thrombophilic condition, Smoking, Obesity, Older age  Rx Insurance Coverage: Commercial Rx Affordability: Eliquis  is $0/month as she has met her out of pocket max this year.   Past Medical History:  Diagnosis Date   ADD (attention deficit disorder)    Asthma    Eczema    PCOS (polycystic ovarian syndrome)     Past Surgical History:  Procedure Laterality Date   ADENOIDECTOMY     LAPAROSCOPIC OVARIAN CYSTECTOMY Left    TONSILLECTOMY     WISDOM TOOTH EXTRACTION      Social History   Socioeconomic History   Marital status: Single    Spouse name: Not on file   Number of children: Not on file   Years of education: Not on file   Highest education level: Not on file  Occupational History   Not on file  Tobacco Use   Smoking status: Never    Passive exposure: Never   Smokeless tobacco: Never  Vaping Use   Vaping status: Never Used  Substance and Sexual Activity   Alcohol use: Never   Drug use: Never   Sexual activity: Not on file  Other Topics Concern   Not on file  Social History Narrative   Not on file   Social Drivers of Health   Financial Resource Strain: Patient Declined (06/21/2023)   Received from [facility]   Overall Financial Resource Strain (CARDIA)    Difficulty of Paying Living Expenses: Patient declined  Recent Concern: Physicist, medical Strain -  High Risk (03/23/2023)   Received from [facility]   Overall Financial Resource Strain (CARDIA)    Difficulty of Paying Living Expenses: Hard  Food Insecurity: Patient Declined (06/21/2023)   Received from [facility]   Hunger Vital Sign    Within the past 12 months, you worried that your food would run out before you got the money to buy more.: Patient declined    Within the past 12 months, the food you bought just didn't last and you didn't  have money to get more.: Patient declined  Recent Concern: Food Insecurity - Food Insecurity Present (03/23/2023)   Received from [facility]   Hunger Vital Sign    Worried About Running Out of Food in the Last Year: Sometimes true    Ran Out of Food in the Last Year: Sometimes true  Transportation Needs: Patient Declined (06/21/2023)   Received from [facility] - Transportation    Lack of Transportation (Medical): Patient declined    Lack of Transportation (Non-Medical): Patient declined  Physical Activity: Unknown (06/21/2023)   Received from [facility]   Exercise Vital Sign    On average, how many days per week do you engage in moderate to strenuous exercise (like a brisk walk)?: Patient declined    On average, how many minutes do you engage in exercise at this level?: 20 min  Recent Concern: Physical Activity - Insufficiently Active (03/23/2023)   Received from [facility]   Exercise Vital Sign    Days of Exercise per Week: 3 days    Minutes of Exercise per Session: 20 min  Stress: Patient Declined (06/21/2023)   Received from [facility] - Occupational Stress Questionnaire    Feeling of Stress : Patient declined  Social Connections: Patient Declined (06/21/2023)   Received from [facility]   Social Network    How would you rate your social network (family, work, friends)?: Patient declined  Recent Concern: Social Connections - Somewhat Isolated (03/23/2023)   Received from [facility]   Social Network    How would you rate your social network (family, work, friends)?: Restricted participation with some degree of social isolation  Intimate Partner Violence: Not At Risk (06/21/2023)   Received from [facility]   HITS    Over the last 12 months how often did your partner physically hurt you?: Never    Over the last 12 months how often did your partner insult you or talk down to you?: Never    Over the last 12  months how often did your partner threaten you with physical harm?: Never    Over the last 12 months how often did your partner scream or curse at you?: Never    Family History  Problem Relation Age of Onset   Urticaria Mother    Asthma Mother    Eczema Sister    Asthma Sister     Allergies as of 10/03/2023 - Review Complete 10/03/2023  Allergen Reaction Noted   Doxycycline hyclate Nausea And Vomiting 05/19/2023   Prednisone Hives 05/19/2023    Current Outpatient Medications on File Prior to Visit  Medication Sig Dispense Refill   apixaban  (ELIQUIS ) 5 MG TABS tablet Take 1 tablet (5 mg total) by mouth 2 (two) times daily. Start taking after completion of starter pack. 60 tablet 1   propranolol (INDERAL) 20 MG tablet Take 20 mg by mouth daily.     sertraline (ZOLOFT) 50 MG tablet Take  150 mg by mouth daily.     acetaminophen  (TYLENOL ) 650 MG CR tablet Take 650 mg by mouth every 8 (eight) hours as needed. (Patient not taking: Reported on 09/16/2023)     albuterol (ACCUNEB) 0.63 MG/3ML nebulizer solution Take 3 mLs (0.63 mg dose) by nebulization every 6 (six) hours as needed for Wheezing.     albuterol (VENTOLIN HFA) 108 (90 Base) MCG/ACT inhaler Inhale 2 puffs into the lungs every 4 (four) hours as needed.     amphetamine-dextroamphetamine (ADDERALL) 5 MG tablet Take 1 tablet by mouth daily.     B Complex-C (SUPER B COMPLEX/VITAMIN C PO) Take 1 tablet by mouth daily.     budesonide -formoterol  (SYMBICORT ) 80-4.5 MCG/ACT inhaler Inhale 2 puffs into the lungs in the morning and at bedtime. with spacer and rinse mouth afterwards. 1 each 5   cetirizine (ZYRTEC) 10 MG chewable tablet Chew by mouth.     lamoTRIgine (LAMICTAL) 150 MG tablet Take 150 mg by mouth daily.     lisdexamfetamine (VYVANSE) 50 MG capsule Take 50 mg by mouth every morning.     Spacer/Aero-Holding Chambers (AEROCHAMBER MV) inhaler Use as instructed 1 each 2   UNABLE TO FIND Take 80 mg by mouth daily. Med Name: Silexan  (lavender extract)     No current facility-administered medications on file prior to visit.   LABS:  CBC     Component Value Date/Time   WBC 9.1 05/19/2023 0552   RBC 4.40 05/19/2023 0552   HGB 12.8 05/19/2023 0552   HCT 38.2 05/19/2023 0552   PLT 345 05/19/2023 0552   MCV 86.8 05/19/2023 0552   MCH 29.1 05/19/2023 0552   MCHC 33.5 05/19/2023 0552   RDW 12.4 05/19/2023 0552   LYMPHSABS 4.4 (H) 05/19/2023 0552   MONOABS 0.4 05/19/2023 0552   EOSABS 0.3 05/19/2023 0552   BASOSABS 0.1 05/19/2023 0552    Hepatic Function   No results found for: PROT, ALBUMIN, AST, ALT, ALKPHOS, BILITOT, BILIDIR, IBILI  Renal Function   Lab Results  Component Value Date   CREATININE 0.89 05/19/2023    CrCl cannot be calculated (Patient's most recent lab result is older than the maximum 21 days allowed.).   VVS Vascular Lab Studies:  07/09/23 VAS US  UPPER EXTREMITY VENOUS DUPLEX LEFT  Summary:    Right:  No evidence of thrombosis in the subclavian.    Left:  No evidence of superficial vein thrombosis in the upper extremity.  Findings consistent with acute deep vein thrombosis involving one of the left brachial veins.   ASSESSMENT: Location of DVT: Left upper extremity Cause of DVT: provoked by a transient risk factor  Patient without prior history of DVT diagnosed with an acute DVT in one of the left brachial veins. She is s/p ovarian cystectomy on 06/04/23. Her IV's for this were placed in the left arm. Also reports multiple lab draws in the left arm. She began to have pain in the left arm shortly after surgery but it began to worsen and start swelling over the last 5 days. She was also recently started on a combined oral contraceptive in January. Recent initiation of estrogen therapy is a risk factor for DVT as well. Her gynecologist stopped the COC and will avoid estrogen. She was switched to Depo-Provera, which the patient does not plan to continue on. This does have a higher  risk of VTE compared to other progestin only options. We have planned to anticoagulate for a total of 3 months, which she  is nearing the end of. She has tolerated Eliquis  treatment well and experienced complete resolution of symptoms. Adherence has been perfect. Will have her finish out her current supply of Eliquis , which will finish her 3rd month then she can discontinue treatment. No need for repeat imaging.   PLAN: -Patient is discharged from the DVT Clinic. -Discontinue anticoagulation with Eliquis  after finishing current supply (~1 more week), as patient has completed 3 months of treatment for provoked DVT.  -Counseled patient on future VTE risk reduction strategies and to inform all future providers of DVT history.  Follow up: No further follow up needed in DVT Clinic at this time.  Lum Herald, PharmD, [place], CPP Deep Vein Thrombosis Clinic Clinical Pharmacist Practitioner [phone]  I have evaluated the patient's chart/imaging and refer this patient to the Clinical Pharmacist Practitioner for medication management. I have reviewed the CPP's documentation and agree with her assessment and plan. I was immediately available during the visit for questions and collaboration.   Fonda FORBES Rim, MD

## 2023-10-03 NOTE — Patient Instructions (Signed)
 You have been discharged from the DVT Clinic! No further follow up in the DVT Clinic is needed.  -Continue taking Eliquis  5 mg twice daily until you finish out your current supply (~1 week), then discontinue.   Please reach out if any questions come up at [phone].

## 2023-10-05 NOTE — Progress Notes (Signed)
 [facility] Visit Note    Patty Mccarthy is a 23 y.o. female who presents with URI symptoms for 2 weeks with extensive symptoms.   History obtained from patient.   Assessment & Plan:    Assessment & Plan Generalized body aches  Orders: .  Influenza Molecular POCT .  Strep Molecular POCT  Acute maxillary sinusitis, recurrence not specified  Orders: .  amoxicillin-clavulanate (AUGMENTIN) 875-125 mg tablet; Take 1 tablet by mouth 2 (two) times a day for 10 days  Acute suppurative otitis media of both ears without spontaneous rupture of tympanic membranes, recurrence not specified  Orders: .  amoxicillin-clavulanate (AUGMENTIN) 875-125 mg tablet; Take 1 tablet by mouth 2 (two) times a day for 10 days  Nausea without vomiting  Orders: .  promethazine (PHENERGAN) 25 MG tablet; Take 0.5-1 tablets (12.5-25 mg total) by mouth every 4 (four) hours as needed for nausea for up to 2 days    No follow-ups on file.  Subjective     Respiratory Duration of current symptoms:  2 weeks Cough Characteristics: productive of sputum   Productive Cough: brown   Associated symptoms: body aches, chest tightness, chills, cough, nasal congestion, decreased appetite, diarrhea, ear pain, fatigue, fever, headache, nausea, runny nose, sinus pain, sneezing, sore throat, sweats, swollen glands and wheezing   Associated symptoms: no chest pain, no postnasal drip, no rash, no shortness of breath, no syncope and no vomiting   Treatments tried:  Applying ice, rest, humidifier or steam, pain relievers, increased fluid intake, over-the-counter medication and prescription medication Worsened by: eating and swallowing   Worsened by comment:  Lying down Response to treatment:  Relieved symptoms Relief: mild relief   Special considerations:  None apply    Allergies  Allergen Reactions  . Other Other (See Comments)    ENVIRONMENTAL ALLERGIES - DUST, ANIMALS, POLLEN ETC.  . Doxycycline GI Intolerance  .  Doxycycline Hcl   . Prednisone   . Prednisone Rash and Hives    Review of Systems  Constitutional:  Positive for appetite change, chills, diaphoresis, fatigue and fever.  HENT:  Positive for congestion, ear pain, rhinorrhea, sinus pain, sneezing and sore throat. Negative for postnasal drip.   Respiratory:  Positive for cough, chest tightness and wheezing. Negative for shortness of breath.   Cardiovascular:  Negative for chest pain.  Gastrointestinal:  Positive for diarrhea and nausea. Negative for vomiting.  Musculoskeletal:  Positive for myalgias.  Skin:  Negative for rash.  Neurological:  Positive for headaches. Negative for syncope.     Objective     Vital Signs: BP 132/89   Pulse (!) 114   Temp (!) 100 F (37.8 C)   Resp 20   Ht 5' 5 (1.651 m)   Wt 185 lb (83.9 kg)   LMP 10/05/2023 (Exact Date)   SpO2 100%   BMI 30.79 kg/m    Physical Exam Vitals reviewed.  Constitutional:      General: She is awake.     Appearance: She is obese. She is ill-appearing.  HENT:     Head: Normocephalic and atraumatic.     Right Ear: Hearing, tympanic membrane, ear canal and external ear normal. There is no impacted cerumen.     Left Ear: Hearing, tympanic membrane, ear canal and external ear normal. There is no impacted cerumen.     Nose: Congestion and rhinorrhea present. Rhinorrhea is purulent.     Right Sinus: Maxillary sinus tenderness and frontal sinus tenderness present.     Left Sinus:  Maxillary sinus tenderness and frontal sinus tenderness present.     Comments: pnd    Mouth/Throat:     Lips: Pink.     Mouth: Mucous membranes are moist.     Pharynx: Oropharynx is clear. Uvula midline. Posterior oropharyngeal erythema present. No pharyngeal swelling, oropharyngeal exudate or uvula swelling.  Eyes:     Pupils: Pupils are equal, round, and reactive to light.  Cardiovascular:     Rate and Rhythm: Normal rate and regular rhythm.     Heart sounds: Normal heart sounds, S1 normal  and S2 normal. Heart sounds not distant. No murmur heard.    No friction rub. No gallop.  Pulmonary:     Effort: Pulmonary effort is normal. No respiratory distress.     Breath sounds: Normal breath sounds and air entry. No stridor, decreased air movement or transmitted upper airway sounds. No decreased breath sounds, wheezing, rhonchi or rales.  Chest:     Chest wall: No tenderness.  Musculoskeletal:        General: Normal range of motion.     Cervical back: Normal range of motion and neck supple. Tenderness present. No rigidity.  Lymphadenopathy:     Head:     Right side of head: Tonsillar adenopathy present. No submental, submandibular, preauricular, posterior auricular or occipital adenopathy.     Left side of head: Tonsillar adenopathy present. No submental, submandibular, preauricular, posterior auricular or occipital adenopathy.     Cervical: No cervical adenopathy.  Skin:    General: Skin is warm.  Neurological:     General: No focal deficit present.     Mental Status: She is alert and oriented to person, place, and time.     GCS: GCS eye subscore is 4. GCS verbal subscore is 5. GCS motor subscore is 6.  Psychiatric:        Mood and Affect: Mood normal.        Speech: Speech normal.        Behavior: Behavior normal. Behavior is cooperative.

## 2023-12-25 ENCOUNTER — Other Ambulatory Visit (HOSPITAL_COMMUNITY): Payer: Self-pay

## 2024-02-03 ENCOUNTER — Other Ambulatory Visit: Payer: Self-pay | Admitting: Allergy

## 2024-02-03 DIAGNOSIS — J302 Other seasonal allergic rhinitis: Secondary | ICD-10-CM

## 2024-03-15 ENCOUNTER — Other Ambulatory Visit: Payer: Self-pay | Admitting: Allergy
